# Patient Record
Sex: Male | Born: 1995 | Race: White | Hispanic: No | Marital: Single | Smoking: Never smoker
Health system: Southern US, Community
[De-identification: ages and names within clinical notes are randomized; demographics above are authoritative.]

---

## 2014-01-13 ENCOUNTER — Emergency Department (HOSPITAL_COMMUNITY): Payer: Self-pay

## 2014-01-13 ENCOUNTER — Encounter (HOSPITAL_COMMUNITY): Payer: Self-pay | Admitting: Emergency Medicine

## 2014-01-13 ENCOUNTER — Emergency Department (HOSPITAL_COMMUNITY)
Admission: EM | Admit: 2014-01-13 | Discharge: 2014-01-13 | Disposition: A | Discharge status: Home / Self Care | Payer: Self-pay | Attending: Emergency Medicine | Admitting: Emergency Medicine

## 2014-01-13 DIAGNOSIS — R197 Diarrhea, unspecified: Secondary | ICD-10-CM | POA: Insufficient documentation

## 2014-01-13 DIAGNOSIS — R109 Unspecified abdominal pain: Secondary | ICD-10-CM

## 2014-01-13 DIAGNOSIS — I499 Cardiac arrhythmia, unspecified: Secondary | ICD-10-CM | POA: Insufficient documentation

## 2014-01-13 DIAGNOSIS — Z792 Long term (current) use of antibiotics: Secondary | ICD-10-CM | POA: Insufficient documentation

## 2014-01-13 LAB — URINALYSIS, ROUTINE W REFLEX MICROSCOPIC
Bilirubin Urine: NEGATIVE
Glucose, UA: NEGATIVE mg/dL
Hgb urine dipstick: NEGATIVE
KETONES UR: NEGATIVE mg/dL
LEUKOCYTES UA: NEGATIVE
Nitrite: NEGATIVE
PROTEIN: NEGATIVE mg/dL
Specific Gravity, Urine: >1.03 — ABNORMAL HIGH (ref 1.005–1.030)
UROBILINOGEN UA: 0.2 mg/dL (ref 0.0–1.0)
pH: 5.5 (ref 5.0–8.0)

## 2014-01-13 LAB — LIPASE, BLOOD: LIPASE: 26 U/L (ref 11–59)

## 2014-01-13 LAB — COMPREHENSIVE METABOLIC PANEL
ALBUMIN: 4.3 g/dL (ref 3.5–5.2)
ALT: 50 U/L (ref 0–53)
AST: 23 U/L (ref 0–37)
Alkaline Phosphatase: 120 U/L — ABNORMAL HIGH (ref 39–117)
Anion gap: 12 (ref 5–15)
BUN: 10 mg/dL (ref 6–23)
CALCIUM: 9.8 mg/dL (ref 8.4–10.5)
CO2: 27 meq/L (ref 19–32)
Chloride: 102 mEq/L (ref 96–112)
Creatinine, Ser: 0.79 mg/dL (ref 0.50–1.35)
GFR calc Af Amer: >90 mL/min (ref >90)
Glucose, Bld: 108 mg/dL — ABNORMAL HIGH (ref 70–99)
Potassium: 4 mEq/L (ref 3.7–5.3)
SODIUM: 141 meq/L (ref 137–147)
Total Bilirubin: 0.5 mg/dL (ref 0.3–1.2)
Total Protein: 7.9 g/dL (ref 6.0–8.3)

## 2014-01-13 LAB — CBC WITH DIFFERENTIAL/PLATELET
BASOS ABS: 0 10*3/uL (ref 0.0–0.1)
Basophils Relative: 0 % (ref 0–1)
EOS PCT: 3 % (ref 0–5)
Eosinophils Absolute: 0.2 10*3/uL (ref 0.0–0.7)
HCT: 48.1 % (ref 39.0–52.0)
Hemoglobin: 16.4 g/dL (ref 13.0–17.0)
Lymphocytes Relative: 42 % (ref 12–46)
Lymphs Abs: 3.2 10*3/uL (ref 0.7–4.0)
MCH: 29.8 pg (ref 26.0–34.0)
MCHC: 34.1 g/dL (ref 30.0–36.0)
MCV: 87.3 fL (ref 78.0–100.0)
Monocytes Absolute: 0.7 10*3/uL (ref 0.1–1.0)
Monocytes Relative: 9 % (ref 3–12)
NEUTROS ABS: 3.6 10*3/uL (ref 1.7–7.7)
Neutrophils Relative %: 46 % (ref 43–77)
PLATELETS: 324 10*3/uL (ref 150–400)
RBC: 5.51 MIL/uL (ref 4.22–5.81)
RDW: 12.4 % (ref 11.5–15.5)
WBC: 7.7 10*3/uL (ref 4.0–10.5)

## 2014-01-13 MED ORDER — IOHEXOL 300 MG/ML  SOLN
50.0000 mL | Freq: Once | INTRAMUSCULAR | Status: AC | PRN
  Administered 2014-01-13: 50 mL via ORAL

## 2014-01-13 MED ORDER — SODIUM CHLORIDE 0.9 % IV BOLUS (SEPSIS)
1000.0000 mL | Freq: Once | INTRAVENOUS | Status: AC
  Administered 2014-01-13: 1000 mL via INTRAVENOUS

## 2014-01-13 MED ORDER — ONDANSETRON HCL 4 MG/2ML IJ SOLN
4.0000 mg | Freq: Once | INTRAMUSCULAR | Status: AC
  Administered 2014-01-13: 4 mg via INTRAVENOUS
  Filled 2014-01-13: qty 2

## 2014-01-13 MED ORDER — ONDANSETRON HCL 4 MG PO TABS: 4.0000 mg | ORAL_TABLET | Freq: Four times a day (QID) | ORAL | Status: AC

## 2014-01-13 MED ORDER — MORPHINE SULFATE 4 MG/ML IJ SOLN
4.0000 mg | Freq: Once | INTRAMUSCULAR | Status: AC
  Administered 2014-01-13: 4 mg via INTRAVENOUS
  Filled 2014-01-13: qty 1

## 2014-01-13 MED ORDER — IOHEXOL 300 MG/ML  SOLN
100.0000 mL | Freq: Once | INTRAMUSCULAR | Status: AC | PRN
  Administered 2014-01-13: 100 mL via INTRAVENOUS

## 2014-01-13 NOTE — ED Provider Notes (Signed)
CSN: [id number]     Arrival date & time 01/13/14  [id number] History   First MD Initiated Contact with Patient 01/13/14 0356     Chief Complaint  Patient presents with  . Abdominal Pain     (Consider location/radiation/quality/duration/timing/severity/associated sxs/prior Treatment) HPI Comments: Patient complains of one hour of diffuse abdominal pain worsens epigastrium that is constant. Denies any nausea, vomiting or diarrhea. He states just prior to my evaluation he had an episode of bloody diarrhea. Denies any fever, urinary symptoms. Denies any sick contacts. Denies having this pain in the past. Denies any previous evaluations for this problem. Denies any testicular pain. States the pain woke him from sleep. Nothing makes it better or worse. No previous abdominal surgeries  The history is provided by the patient.    History reviewed. No pertinent past medical history. History reviewed. No pertinent past surgical history. No family history on file. History  Substance Use Topics  . Smoking status: Never Smoker   . Smokeless tobacco: Not on file  . Alcohol Use: No    Review of Systems  Constitutional: Negative for fever, activity change and appetite change.  HENT: Negative for congestion and rhinorrhea.   Eyes: Negative for visual disturbance.  Respiratory: Negative for cough, chest tightness and shortness of breath.   Cardiovascular: Negative for chest pain.  Gastrointestinal: Positive for abdominal pain, diarrhea and blood in stool. Negative for nausea and vomiting.  Genitourinary: Negative for dysuria, urgency, hematuria and testicular pain.  Musculoskeletal: Negative for arthralgias, back pain and myalgias.  Skin: Negative for rash.  Neurological: Negative for dizziness, weakness and headaches.  A complete 10 system review of systems was obtained and all systems are negative except as noted in the HPI and PMH.      Allergies  Review of patient's allergies indicates no known  allergies.  Home Medications   Prior to Admission medications   Medication Sig Start Date End Date Taking? Authorizing Provider  amoxicillin (AMOXIL) 500 MG capsule Take 500 mg by mouth 3 (three) times daily.   Yes Historical Provider, MD  ondansetron (ZOFRAN) 4 MG tablet Take 1 tablet (4 mg total) by mouth every 6 (six) hours. 01/13/14   Glynn OctaveStephen Jacy Howat, MD   BP 130/93  Pulse 84  Temp(Src) 97.7 F (36.5 C) (Oral)  Resp 20  Ht 6\' 2"  (1.88 m)  Wt 220 lb (99.791 kg)  BMI 28.23 kg/m2  SpO2 100% Physical Exam  Nursing note and vitals reviewed. Constitutional: He is oriented to person, place, and time. He appears well-developed and well-nourished. No distress.  HENT:  Head: Normocephalic and atraumatic.  Mouth/Throat: Oropharynx is clear and moist. No oropharyngeal exudate.  Eyes: Conjunctivae and EOM are normal. Pupils are equal, round, and reactive to light.  Neck: Normal range of motion. Neck supple.  No meningismus.  Cardiovascular: Normal rate, normal heart sounds and intact distal pulses.   No murmur heard. Irregular rhythm  Pulmonary/Chest: Effort normal and breath sounds normal. No respiratory distress.  Abdominal: Soft. There is tenderness. There is no rebound and no guarding.  Diffuse tenderness, worse in the epigastrium  Genitourinary:  Normal external rectal exam. No hemorrhoids Patient refuses digital exam.  Musculoskeletal: Normal range of motion. He exhibits no edema and no tenderness.  Neurological: He is alert and oriented to person, place, and time. No cranial nerve deficit. He exhibits normal muscle tone. Coordination normal.  No ataxia on finger to nose bilaterally. No pronator drift. 5/5 strength throughout. CN 2-12 intact. Negative Romberg.  Equal grip strength. Sensation intact. Gait is normal.   Skin: Skin is warm.  Psychiatric: He has a normal mood and affect. His behavior is normal.    ED Course  Procedures (including critical care time) Labs  Review Labs Reviewed  URINALYSIS, ROUTINE W REFLEX MICROSCOPIC - Abnormal; Notable for the following:    APPearance HAZY (*)    Specific Gravity, Urine >1.030 (*)    All other components within normal limits  COMPREHENSIVE METABOLIC PANEL - Abnormal; Notable for the following:    Glucose, Bld 108 (*)    Alkaline Phosphatase 120 (*)    All other components within normal limits  CBC WITH DIFFERENTIAL  LIPASE, BLOOD    Imaging Review Ct Abdomen Pelvis W Contrast  01/13/2014   CLINICAL DATA:  Sharp epigastric pain onset 1 hour prior arrival. Vomiting.  EXAM: CT ABDOMEN AND PELVIS WITH CONTRAST  TECHNIQUE: Multidetector CT imaging of the abdomen and pelvis was performed using the standard protocol following bolus administration of intravenous contrast.  CONTRAST:  50mL OMNIPAQUE IOHEXOL 300 MG/ML SOLN, 100mL OMNIPAQUE IOHEXOL 300 MG/ML SOLN  COMPARISON:  None.  FINDINGS: Lung bases are clear.  The liver, spleen, gallbladder, pancreas, adrenal glands, kidneys, abdominal aorta, inferior vena cava, and retroperitoneal lymph nodes are unremarkable. Stomach is filled with contrast material and stool. No gastric wall thickening. Visualize small bowel and colon are unremarkable for degree of distention. No free air or free fluid in the abdomen. No bowel wall thickening is appreciated. Mild prominence of lymph nodes in the right lower quadrant likely represent reactive change. Abdominal wall musculature appears intact.  Pelvis: Prostate gland is not enlarged. Bladder wall is not thickened. Appendix is normal. No evidence of diverticulitis. No free or loculated pelvic fluid collections. No pelvic mass or lymphadenopathy. Normal alignment of the lumbar spine.  IMPRESSION: No acute process demonstrated in the abdomen or pelvis.   Electronically Signed   By: Burman NievesWilliam  Stevens M.D.   On: 01/13/2014 05:38     EKG Interpretation   Date/Time:  Thursday January 13 2014 06:09:23 EDT Ventricular Rate:  55 PR  Interval:  106 QRS Duration: 122 QT Interval:  410 QTC Calculation: 392 R Axis:   24 Text Interpretation:  Sinus rhythm Short PR interval Right bundle branch  block No previous ECGs available Confirmed by Christeena Krogh  MD, Jerrol Helmers [id number])  on 01/13/2014 6:12:59 AM      MDM   Final diagnoses:  Abdominal pain, unspecified abdominal location   abdominal pain with one episode of diarrhea. Abdomen soft without peritoneal signs. Patient refuses rectal exam. No RUQ tenderness.  Friends states patient was hospitalized 2 weeks ago in [place] for difficulty breathing, fever, and irregular heart beat. He states he was diagnosed with strep throat and wore a heart monitor without diagnosis.   EKG obtained given irregular rhythm on exam.  CBC normal. UA negative. Labs unremarkable.   CT negative. No acute pathology.  Pain improved in the ED.  Abdomen soft.  Patient tolerating PO. He is visiting from [place] and plans to return there in 2 days.  Return precaution discussed.   BP 130/93  Pulse 84  Temp(Src) 97.7 F (36.5 C) (Oral)  Resp 20  Ht 6\' 2"  (1.88 m)  Wt 220 lb (99.791 kg)  BMI 28.23 kg/m2  SpO2 100%   Glynn OctaveStephen Loribeth Katich, MD 01/13/14 [phone]

## 2014-01-13 NOTE — ED Notes (Signed)
Pt reports onset of abd pain that started about an hour ago, reports sharp pains, but denies n/v/d. Patient and friend report pt has had multiple hospital visits for various complaints without a definitive diagnosis.

## 2014-01-13 NOTE — Discharge Instructions (Signed)
Abdominal Pain °Your testing is negative for a serious cause of your abdominal pain.  Follow up with your doctor. Return to the ED if you develop new or worsening symptoms. °Many things can cause abdominal pain. Usually, abdominal pain is not caused by a disease and will improve without treatment. It can often be observed and treated at home. Your health care provider will do a physical exam and possibly order blood tests and X-rays to help determine the seriousness of your pain. However, in many cases, more time must pass before a clear cause of the pain can be found. Before that point, your health care provider may not know if you need more testing or further treatment. °HOME CARE INSTRUCTIONS  °Monitor your abdominal pain for any changes. The following actions may help to alleviate any discomfort you are experiencing: °· Only take over-the-counter or prescription medicines as directed by your health care provider. °· Do not take laxatives unless directed to do so by your health care provider. °· Try a clear liquid diet (broth, tea, or water) as directed by your health care provider. Slowly move to a bland diet as tolerated. °SEEK MEDICAL CARE IF: °· You have unexplained abdominal pain. °· You have abdominal pain associated with nausea or diarrhea. °· You have pain when you urinate or have a bowel movement. °· You experience abdominal pain that wakes you in the night. °· You have abdominal pain that is worsened or improved by eating food. °· You have abdominal pain that is worsened with eating fatty foods. °· You have a fever. °SEEK IMMEDIATE MEDICAL CARE IF:  °· Your pain does not go away within 2 hours. °· You keep throwing up (vomiting). °· Your pain is felt only in portions of the abdomen, such as the right side or the left lower portion of the abdomen. °· You pass bloody or black tarry stools. °MAKE SURE YOU: °· Understand these instructions.   °· Will watch your condition.   °· Will get help right away if  you are not doing well or get worse.   °Document Released: 12/19/2004 Document Revised: 03/16/2013 Document Reviewed: 11/18/2012 °ExitCare® Patient Information ©2015 [employer]. This information is not intended to replace advice given to you by your health care provider. Make sure you discuss any questions you have with your health care provider. ° °

## 2014-11-17 ENCOUNTER — Emergency Department (HOSPITAL_COMMUNITY): Payer: Self-pay

## 2014-11-17 ENCOUNTER — Emergency Department (HOSPITAL_COMMUNITY)
Admission: EM | Admit: 2014-11-17 | Discharge: 2014-11-17 | Disposition: A | Discharge status: Home / Self Care | Payer: Self-pay | Attending: Emergency Medicine | Admitting: Emergency Medicine

## 2014-11-17 ENCOUNTER — Encounter (HOSPITAL_COMMUNITY): Payer: Self-pay | Admitting: Emergency Medicine

## 2014-11-17 DIAGNOSIS — Z792 Long term (current) use of antibiotics: Secondary | ICD-10-CM | POA: Insufficient documentation

## 2014-11-17 DIAGNOSIS — R000 Tachycardia, unspecified: Secondary | ICD-10-CM | POA: Insufficient documentation

## 2014-11-17 DIAGNOSIS — R079 Chest pain, unspecified: Secondary | ICD-10-CM

## 2014-11-17 DIAGNOSIS — R071 Chest pain on breathing: Secondary | ICD-10-CM

## 2014-11-17 DIAGNOSIS — J189 Pneumonia, unspecified organism: Secondary | ICD-10-CM

## 2014-11-17 DIAGNOSIS — J159 Unspecified bacterial pneumonia: Secondary | ICD-10-CM | POA: Insufficient documentation

## 2014-11-17 LAB — TROPONIN I

## 2014-11-17 LAB — CBC WITH DIFFERENTIAL/PLATELET
BASOS ABS: 0 10*3/uL (ref 0.0–0.1)
Basophils Relative: 0 % (ref 0–1)
EOS PCT: 11 % — AB (ref 0–5)
Eosinophils Absolute: 1.2 10*3/uL — ABNORMAL HIGH (ref 0.0–0.7)
HCT: 45.1 % (ref 39.0–52.0)
HEMOGLOBIN: 15.6 g/dL (ref 13.0–17.0)
LYMPHS ABS: 2 10*3/uL (ref 0.7–4.0)
Lymphocytes Relative: 17 % (ref 12–46)
MCH: 30.7 pg (ref 26.0–34.0)
MCHC: 34.6 g/dL (ref 30.0–36.0)
MCV: 88.8 fL (ref 78.0–100.0)
Monocytes Absolute: 0.7 10*3/uL (ref 0.1–1.0)
Monocytes Relative: 6 % (ref 3–12)
NEUTROS PCT: 66 % (ref 43–77)
Neutro Abs: 7.4 10*3/uL (ref 1.7–7.7)
PLATELETS: 344 10*3/uL (ref 150–400)
RBC: 5.08 MIL/uL (ref 4.22–5.81)
RDW: 12.1 % (ref 11.5–15.5)
WBC: 11.4 10*3/uL — AB (ref 4.0–10.5)

## 2014-11-17 LAB — BASIC METABOLIC PANEL
ANION GAP: 9 (ref 5–15)
BUN: 7 mg/dL (ref 6–20)
CHLORIDE: 102 mmol/L (ref 101–111)
CO2: 27 mmol/L (ref 22–32)
Calcium: 9.1 mg/dL (ref 8.9–10.3)
Creatinine, Ser: 0.8 mg/dL (ref 0.61–1.24)
GFR calc Af Amer: >60 mL/min (ref >60)
Glucose, Bld: 113 mg/dL — ABNORMAL HIGH (ref 65–99)
POTASSIUM: 3.4 mmol/L — AB (ref 3.5–5.1)
SODIUM: 138 mmol/L (ref 135–145)

## 2014-11-17 MED ORDER — ONDANSETRON HCL 4 MG/2ML IJ SOLN
4.0000 mg | Freq: Once | INTRAMUSCULAR | Status: AC
  Administered 2014-11-17: 4 mg via INTRAVENOUS
  Filled 2014-11-17: qty 2

## 2014-11-17 MED ORDER — GUAIFENESIN-CODEINE 100-10 MG/5ML PO SOLN: 5.0000 mL | Freq: Four times a day (QID) | ORAL | Status: AC | PRN

## 2014-11-17 MED ORDER — IOHEXOL 350 MG/ML SOLN
100.0000 mL | Freq: Once | INTRAVENOUS | Status: AC | PRN
  Administered 2014-11-17: 100 mL via INTRAVENOUS

## 2014-11-17 MED ORDER — IBUPROFEN 800 MG PO TABS: 800.0000 mg | ORAL_TABLET | Freq: Three times a day (TID) | ORAL | Status: AC | PRN

## 2014-11-17 MED ORDER — LEVOFLOXACIN IN D5W 750 MG/150ML IV SOLN
750.0000 mg | Freq: Once | INTRAVENOUS | Status: AC
  Administered 2014-11-17: 750 mg via INTRAVENOUS
  Filled 2014-11-17: qty 150

## 2014-11-17 MED ORDER — KETOROLAC TROMETHAMINE 30 MG/ML IJ SOLN
30.0000 mg | Freq: Once | INTRAMUSCULAR | Status: AC
  Administered 2014-11-17: 30 mg via INTRAVENOUS
  Filled 2014-11-17: qty 1

## 2014-11-17 MED ORDER — SODIUM CHLORIDE 0.9 % IV BOLUS (SEPSIS)
1000.0000 mL | Freq: Once | INTRAVENOUS | Status: AC
  Administered 2014-11-17: 1000 mL via INTRAVENOUS

## 2014-11-17 MED ORDER — POTASSIUM CHLORIDE CRYS ER 20 MEQ PO TBCR
40.0000 meq | EXTENDED_RELEASE_TABLET | Freq: Once | ORAL | Status: AC
  Administered 2014-11-17: 40 meq via ORAL
  Filled 2014-11-17: qty 2

## 2014-11-17 MED ORDER — LEVOFLOXACIN 500 MG PO TABS: 500.0000 mg | ORAL_TABLET | Freq: Every day | ORAL | Status: AC

## 2014-11-17 NOTE — ED Notes (Signed)
Pt c/o intermittent chest pain over last couple of days.

## 2014-11-17 NOTE — ED Provider Notes (Signed)
TIME SEEN: 2:25 AM  CHIEF COMPLAINT: Chest pain, shortness of breath, cough  HPI: Pt is a 19 y.o. male who presents to the emergency department with complaints of 3 days of left-sided sharp chest pain that is worse with deep inspiration without radiation that is described as moderate in nature, shortness of breath and dry cough. No alleviating factors. States he's had similar symptoms when he had pneumonia in April. Was admitted to the hospital and [facility] at that time. Denies fever. Denies any recent prolonged immobilization such as long flight, hospitalization, fracture, surgery, trauma. Denies prior history of PE or DVT. Denies being a smoker. No family history of premature CAD. Patient is from [facility] but states currently his living here with a friend.  ROS: See HPI Constitutional: no fever  Eyes: no drainage  ENT: no runny nose   Cardiovascular:   chest pain  Resp:  SOB  GI: no vomiting GU: no dysuria Integumentary: no rash  Allergy: no hives  Musculoskeletal: no leg swelling  Neurological: no slurred speech ROS otherwise negative  PAST MEDICAL HISTORY/PAST SURGICAL HISTORY:  History reviewed. No pertinent past medical history.  MEDICATIONS:  Prior to Admission medications   Medication Sig Start Date End Date Taking? Authorizing Provider  amoxicillin (AMOXIL) 500 MG capsule Take 500 mg by mouth 3 (three) times daily.    Historical Provider, MD  ondansetron (ZOFRAN) 4 MG tablet Take 1 tablet (4 mg total) by mouth every 6 (six) hours. 01/13/14   Glynn Octave, MD    ALLERGIES:  No Known Allergies  SOCIAL HISTORY:  Social History  Substance Use Topics  . Smoking status: Never Smoker   . Smokeless tobacco: Not on file  . Alcohol Use: Yes    FAMILY HISTORY: History reviewed. No pertinent family history.  EXAM: BP 149/89 mmHg  Pulse 118  Temp(Src) 98.4 F (36.9 C)  Ht 6' (1.829 m)  Wt 210 lb (95.255 kg)  BMI 28.47 kg/m2  SpO2 97% CONSTITUTIONAL: Alert and  oriented and responds appropriately to questions. Well-appearing; well-nourished, afebrile, nontoxic but does appear uncomfortable HEAD: Normocephalic EYES: Conjunctivae clear, PERRL ENT: normal nose; no rhinorrhea; moist mucous membranes; pharynx without lesions noted NECK: Supple, no meningismus, no LAD  CARD: Regular and tachycardic; S1 and S2 appreciated; no murmurs, no clicks, no rubs, no gallops RESP: Normal chest excursion without splinting, patient is tachypneic; breath sounds clear and equal bilaterally; no wheezes, no rhonchi, no rales, no hypoxia or respiratory distress, speaking full sentences ABD/GI: Normal bowel sounds; non-distended; soft, non-tender, no rebound, no guarding, no peritoneal signs BACK:  The back appears normal and is non-tender to palpation, there is no CVA tenderness EXT: Normal ROM in all joints; non-tender to palpation; no edema; normal capillary refill; no cyanosis, no calf tenderness or swelling    SKIN: Normal color for age and race; warm NEURO: Moves all extremities equally, sensation to light touch intact diffusely, cranial nerves II through XII intact PSYCH: The patient's mood and manner are appropriate. Grooming and personal hygiene are appropriate.  MEDICAL DECISION MAKING: Patient here with tachycardia, tachypnea, pleuritic chest pain. Differential diagnosis includes pneumonia, pneumothorax, pulmonary embolus. Will obtain labs, CT of patient's chest given my pretest probability for pulmonary embolus is high. We'll give IV fluids, Toradol for pain. He states he drove himself to the emergency department and at this time does not have anyone who can drive him home at this time. EKG shows sinus tachycardia but no other new ischemic changes, interval changes or arrhythmia.  ED PROGRESS: Patient reports his pain is improved with Toradol. His labs show mild leukocytosis of 11.4 without left shift. Troponin negative. CT of his chest shows no pulmonary embolus but he  does have mild peribronchial thickening with scattered groundglass opacities and septal thickening in the right hemithorax likely infectious in nature. He also has reactive right hilar adenopathy. His tachycardia and tachypnea have also improved with IV fluids. He has not been hypoxic in the emergency department. Discussed with patient that I feel he can be discharged on oral antibiotics. Will give dose of IV Levaquin in the emergency department. Will provide him with a coupon for his medications. At this time I do not feel he needs to be admitted to the hospital. He is comfortable with this plan. Discussed usual and customary return precautions. He verbalized understanding.     EKG Interpretation  Date/Time:  Thursday November 17 2014 02:17:09 EDT Ventricular Rate:  114 PR Interval:  150 QRS Duration: 119 QT Interval:  331 QTC Calculation: 456 R Axis:   -85 Text Interpretation:  Sinus tachycardia LAD, consider left anterior fascicular block No significant change since last tracing other than rate is faster Confirmed by Ysabella Babiarz,  DO, Cosette Prindle ([id number]) on 11/17/2014 2:29:31 AM          Layla Maw Latajah Thuman, DO 11/17/14 [id number]

## 2014-11-17 NOTE — Discharge Instructions (Signed)

## 2015-07-12 ENCOUNTER — Emergency Department
Admission: EM | Admit: 2015-07-12 | Discharge: 2015-07-12 | Disposition: A | Discharge status: Home / Self Care | Payer: Self-pay | Attending: Emergency Medicine | Admitting: Emergency Medicine

## 2015-07-12 ENCOUNTER — Encounter: Payer: Self-pay | Admitting: Emergency Medicine

## 2015-07-12 DIAGNOSIS — M25562 Pain in left knee: Secondary | ICD-10-CM | POA: Insufficient documentation

## 2015-07-12 DIAGNOSIS — K920 Hematemesis: Secondary | ICD-10-CM

## 2015-07-12 DIAGNOSIS — Z79899 Other long term (current) drug therapy: Secondary | ICD-10-CM | POA: Insufficient documentation

## 2015-07-12 DIAGNOSIS — M25569 Pain in unspecified knee: Secondary | ICD-10-CM

## 2015-07-12 DIAGNOSIS — Z792 Long term (current) use of antibiotics: Secondary | ICD-10-CM | POA: Insufficient documentation

## 2015-07-12 DIAGNOSIS — J069 Acute upper respiratory infection, unspecified: Secondary | ICD-10-CM | POA: Insufficient documentation

## 2015-07-12 DIAGNOSIS — G8929 Other chronic pain: Secondary | ICD-10-CM | POA: Insufficient documentation

## 2015-07-12 DIAGNOSIS — Z791 Long term (current) use of non-steroidal anti-inflammatories (NSAID): Secondary | ICD-10-CM | POA: Insufficient documentation

## 2015-07-12 LAB — CBC
HEMATOCRIT: 46.6 % (ref 40.0–52.0)
Hemoglobin: 16.1 g/dL (ref 13.0–18.0)
MCH: 30.5 pg (ref 26.0–34.0)
MCHC: 34.5 g/dL (ref 32.0–36.0)
MCV: 88.3 fL (ref 80.0–100.0)
Platelets: 243 10*3/uL (ref 150–440)
RBC: 5.28 MIL/uL (ref 4.40–5.90)
RDW: 13.2 % (ref 11.5–14.5)
WBC: 7.8 10*3/uL (ref 3.8–10.6)

## 2015-07-12 LAB — COMPREHENSIVE METABOLIC PANEL
ALT: 41 U/L (ref 17–63)
ANION GAP: 7 (ref 5–15)
AST: 25 U/L (ref 15–41)
Albumin: 4.6 g/dL (ref 3.5–5.0)
Alkaline Phosphatase: 91 U/L (ref 38–126)
BILIRUBIN TOTAL: 0.8 mg/dL (ref 0.3–1.2)
BUN: 7 mg/dL (ref 6–20)
CO2: 28 mmol/L (ref 22–32)
Calcium: 9.6 mg/dL (ref 8.9–10.3)
Chloride: 105 mmol/L (ref 101–111)
Creatinine, Ser: 0.6 mg/dL — ABNORMAL LOW (ref 0.61–1.24)
Glucose, Bld: 112 mg/dL — ABNORMAL HIGH (ref 65–99)
POTASSIUM: 3.5 mmol/L (ref 3.5–5.1)
Sodium: 140 mmol/L (ref 135–145)
TOTAL PROTEIN: 7.5 g/dL (ref 6.5–8.1)

## 2015-07-12 LAB — LIPASE, BLOOD: Lipase: 17 U/L (ref 11–51)

## 2015-07-12 MED ORDER — PANTOPRAZOLE SODIUM 40 MG PO TBEC
40.0000 mg | DELAYED_RELEASE_TABLET | Freq: Once | ORAL | Status: AC
  Administered 2015-07-12: 40 mg via ORAL

## 2015-07-12 MED ORDER — OMEPRAZOLE 40 MG PO CPDR: 40.0000 mg | DELAYED_RELEASE_CAPSULE | Freq: Every day | ORAL | Status: AC

## 2015-07-12 MED ORDER — PANTOPRAZOLE SODIUM 40 MG PO TBEC
DELAYED_RELEASE_TABLET | ORAL | Status: AC
  Administered 2015-07-12: 40 mg via ORAL
  Filled 2015-07-12: qty 1

## 2015-07-12 NOTE — Discharge Instructions (Signed)
Hematemesis Hematemesis is when you vomit blood. It is a sign of bleeding in the upper part of your digestive tract. This is also called your gastrointestinal (GI) tract. Your upper GI tract includes your mouth, throat, esophagus, stomach, and the first part of your small intestine (duodenum).  Hematemesis is usually caused by bleeding from your esophagus or stomach. You may suddenly vomit bright red blood. You might also vomit old blood. It may look like coffee grounds. You may also have other symptoms, such as:  Stomach pain.  Heartburn.  Black and tarry stool.  HOME CARE INSTRUCTIONS  Watch your hematemesis for any changes. The following actions may help to lessen any discomfort you are feeling:  Take medicines only as directed by your health care provider. Do not take aspirin, ibuprofen, or any other anti-inflammatory medicine without approval from your health care provider.  Rest as needed.  Drink small sips of clear liquids often, as long as you can keep them down. Try to drink enough fluids to keep your urine clear or pale yellow.  Do not drink alcohol.  Do not use any tobacco products, including cigarettes, chewing tobacco, or electronic cigarettes. If you need help quitting, ask your health care provider.  Keep all follow-up visits as directed by your health care provider. This is important. SEEK MEDICAL CARE IF:   The vomiting of blood worsens, or begins again after it has stopped.  You have persistent stomach pain.  You have nausea, indigestion, or heartburn.  You feel weak or dizzy. SEEK IMMEDIATE MEDICAL CARE IF:   You faint or feel extremely weak.  You have a rapid heartbeat.  You are urinating less than normal or not at all.  You have persistent vomiting.  You vomit large amounts of bloody or dark material.  You vomit bright red blood.  You pass large, dark, or bloody stools.  You have chest pain or trouble breathing.   This information is not  intended to replace advice given to you by your health care provider. Make sure you discuss any questions you have with your health care provider.   Document Released: 04/18/2004 Document Revised: 04/01/2014 Document Reviewed: 11/03/2013 Elsevier Interactive Patient Education 2016 [employer]  Knee Pain Knee pain is a very common symptom and can have many causes. Knee pain often goes away when you follow your health care provider's instructions for relieving pain and discomfort at home. However, knee pain can develop into a condition that needs treatment. Some conditions may include:  Arthritis caused by wear and tear (osteoarthritis).  Arthritis caused by swelling and irritation (rheumatoid arthritis or gout).  A cyst or growth in your knee.  An infection in your knee joint.  An injury that will not heal.  Damage, swelling, or irritation of the tissues that support your knee (torn ligaments or tendinitis). If your knee pain continues, additional tests may be ordered to diagnose your condition. Tests may include X-rays or other imaging studies of your knee. You may also need to have fluid removed from your knee. Treatment for ongoing knee pain depends on the cause, but treatment may include:  Medicines to relieve pain or swelling.  Steroid injections in your knee.  Physical therapy.  Surgery. HOME CARE INSTRUCTIONS  Take medicines only as directed by your health care provider.  Rest your knee and keep it raised (elevated) while you are resting.  Do not do things that cause or worsen pain.  Avoid high-impact activities or exercises, such as running, jumping  rope, or doing jumping jacks.  Apply ice to the knee area:  Put ice in a plastic bag.  Place a towel between your skin and the bag.  Leave the ice on for 20 minutes, 2-3 times a day.  Ask your health care provider if you should wear an elastic knee support.  Keep a pillow under your knee when you sleep.  Lose  weight if you are overweight. Extra weight can put pressure on your knee.  Do not use any tobacco products, including cigarettes, chewing tobacco, or electronic cigarettes. If you need help quitting, ask your health care provider. Smoking may slow the healing of any bone and joint problems that you may have. SEEK MEDICAL CARE IF:  Your knee pain continues, changes, or gets worse.  You have a fever along with knee pain.  Your knee buckles or locks up.  Your knee becomes more swollen. SEEK IMMEDIATE MEDICAL CARE IF:   Your knee joint feels hot to the touch.  You have chest pain or trouble breathing.   This information is not intended to replace advice given to you by your health care provider. Make sure you discuss any questions you have with your health care provider.   Document Released: 01/06/2007 Document Revised: 04/01/2014 Document Reviewed: 10/25/2013 Elsevier Interactive Patient Education [employer]

## 2015-07-12 NOTE — ED Provider Notes (Signed)
[facility] Emergency Department Provider Note  ____________________________________________  Time seen: 3:40 AM  I have reviewed the triage vital signs and the nursing notes.   HISTORY  Chief Complaint Abdominal Pain and Hematemesis     HPI Edwin Fischer is a 20 y.o. male presents with multiple medical complaints including left knee pain 7 years with acute worsening yesterday. Patient denies any trauma stating my knee gave out". Patient states he was informed that he had tendinitis in his knees years ago. Patient also admits to nasal congestion times a few days. Patient denies any fever patient also admits to one episode of vomiting today with blood noted. Patient admits to abdominal tightness but no pain at this time. Patient denies any fever no nausea or diarrhea. Patient states that he's been working approximately 60-70 hours per week and is requesting a work note for 2-3 days.    Past medical history None There are no active problems to display for this patient.   History reviewed. No pertinent past surgical history.  Current Outpatient Rx  Name  Route  Sig  Dispense  Refill  . amoxicillin (AMOXIL) 500 MG capsule   Oral   Take 500 mg by mouth 3 (three) times daily.         Marland Kitchen guaiFENesin-codeine 100-10 MG/5ML syrup   Oral   Take 5 mLs by mouth every 6 (six) hours as needed for cough.   120 mL   0   . ibuprofen (ADVIL,MOTRIN) 800 MG tablet   Oral   Take 1 tablet (800 mg total) by mouth every 8 (eight) hours as needed for mild pain.   30 tablet   0   . levofloxacin (LEVAQUIN) 500 MG tablet   Oral   Take 1 tablet (500 mg total) by mouth daily.   7 tablet   0   . ondansetron (ZOFRAN) 4 MG tablet   Oral   Take 1 tablet (4 mg total) by mouth every 6 (six) hours.   12 tablet   0     Allergies No known drug allergies No family history on file.  Social History Social History  Substance Use Topics  . Smoking status: Never Smoker    . Smokeless tobacco: None  . Alcohol Use: Yes    Review of Systems  Constitutional: Negative for fever. Eyes: Negative for visual changes. ENT: Negative for sore throat. Cardiovascular: Negative for chest pain. Respiratory: Negative for shortness of breath. Gastrointestinal: Positive for abdominal "tightness" and vomiting. Genitourinary: Negative for dysuria. Musculoskeletal: Negative for back pain. Skin: Negative for rash. Neurological: Negative for headaches, focal weakness or numbness.   10-point ROS otherwise negative.  ____________________________________________   PHYSICAL EXAM:  VITAL SIGNS: ED Triage Vitals  Enc Vitals Group     BP 07/12/15 0058 160/92 mmHg     Pulse Rate 07/12/15 0058 96     Resp 07/12/15 0058 20     Temp 07/12/15 0058 97.9 F (36.6 C)     Temp Source 07/12/15 0058 Oral     SpO2 07/12/15 0058 99 %     Weight 07/12/15 0058 210 lb (95.255 kg)     Height 07/12/15 0058 6' (1.829 m)     Head Cir --      Peak Flow --      Pain Score 07/12/15 0058 9     Pain Loc --      Pain Edu? --      Excl. in GC? --  Constitutional: Alert and oriented. Well appearing and in no distress. Eyes: Conjunctivae are normal. PERRL. Normal extraocular movements. ENT   Head: Normocephalic and atraumatic.   Nose: No congestion/rhinnorhea.   Mouth/Throat: Mucous membranes are moist.   Neck: No stridor. Hematological/Lymphatic/Immunilogical: No cervical lymphadenopathy. Cardiovascular: Normal rate, regular rhythm. Normal and symmetric distal pulses are present in all extremities. No murmurs, rubs, or gallops. Respiratory: Normal respiratory effort without tachypnea nor retractions. Breath sounds are clear and equal bilaterally. No wheezes/rales/rhonchi. Gastrointestinal: Soft and nontender. No distention. There is no CVA tenderness. Genitourinary: deferred Musculoskeletal: Nontender with normal range of motion in all extremities. No joint  effusions.  No lower extremity tenderness nor edema. Neurologic:  Normal speech and language. No gross focal neurologic deficits are appreciated. Speech is normal.  Skin:  Skin is warm, dry and intact. No rash noted. Psychiatric: Mood and affect are normal. Speech and behavior are normal. Patient exhibits appropriate insight and judgment.  ____________________________________________    LABS (pertinent positives/negatives)  Labs Reviewed  COMPREHENSIVE METABOLIC PANEL - Abnormal; Notable for the following:    Glucose, Bld 112 (*)    Creatinine, Ser 0.60 (*)    All other components within normal limits  LIPASE, BLOOD  CBC  URINALYSIS COMPLETEWITH MICROSCOPIC ([facility] ONLY)          INITIAL IMPRESSION / ASSESSMENT AND PLAN / ED COURSE  Pertinent labs & imaging results that were available during my care of the patient were reviewed by me and considered in my medical decision making (see chart for details).  Regarding the patient's left knee pain that this is a chronic problem for which she will require orthopedic outpatient follow-up which I will refer patient. Regarding the patient's nasal congestion this is more likely secondary to viral etiology versus seasonal allergies. Regarding the patient's episode of bloody emesis of a possible Mallory-Weiss tear secondary to forceful vomiting or potentially peptic ulcer. Given absence of abdominal pain on deep palpation at this time CT scan of the abdomen not performed. I advised patient to return to the emergency department immediately for abdominal pain were to recur or any further emesis.  ____________________________________________   FINAL CLINICAL IMPRESSION(S) / ED DIAGNOSES  Final diagnoses:  Knee pain, chronic, unspecified laterality  URI, acute  Hematemesis without nausea      Darci Currentandolph N Brown, MD 07/12/15 [phone]

## 2015-07-12 NOTE — ED Notes (Signed)
Pt discharged to home.  Family member driving.  Discharge instructions reviewed.  Verbalized understanding.  No questions or concerns at this time.  Teach back verified.  Pt in NAD.  No items left in ED.   

## 2015-07-12 NOTE — ED Notes (Signed)
Patient ambulatory to triage with steady gait, without difficulty or distress noted; pt reports vomiting blood today with lower abd pain; denies hx of same

## 2016-03-12 IMAGING — CT CT ANGIO CHEST
1 of 6 series · 5 of 36 positions shown · IV contrast (Omnipaque 300)
Comparison: None.

CLINICAL DATA: Intermittent chest pain, pain on breathing for 2-3
days.

EXAM:
CT ANGIOGRAPHY CHEST WITH CONTRAST
TECHNIQUE: Multidetector CT imaging of the chest was performed using the
standard protocol during bolus administration of intravenous
contrast. Multiplanar CT image reconstructions and MIPs were
obtained to evaluate the vascular anatomy.
CONTRAST:  100mL OMNIPAQUE IOHEXOL 350 MG/ML SOLN

[Series 4: pe 3.0 b40f · axial · 0.73mm/px · z∈[-198,-48]mm · 5 of 76 slices shown]
[im 13/76  lung]
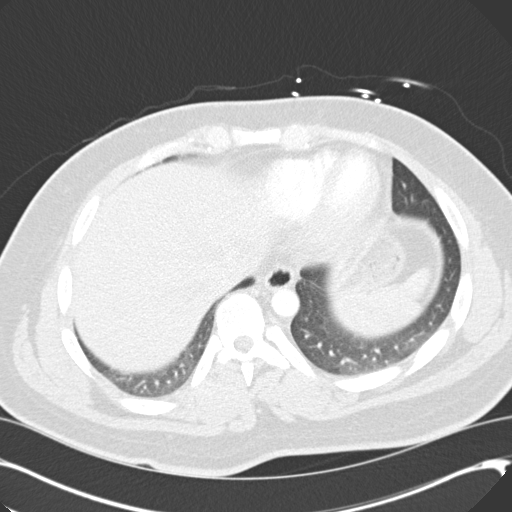
[im 26/76  mediastinal]
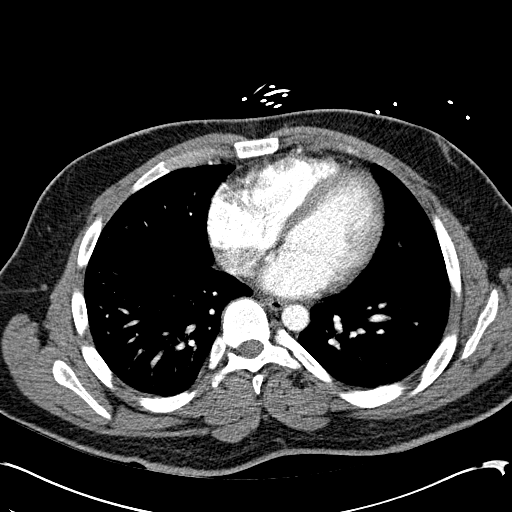
[im 38/76  lung]
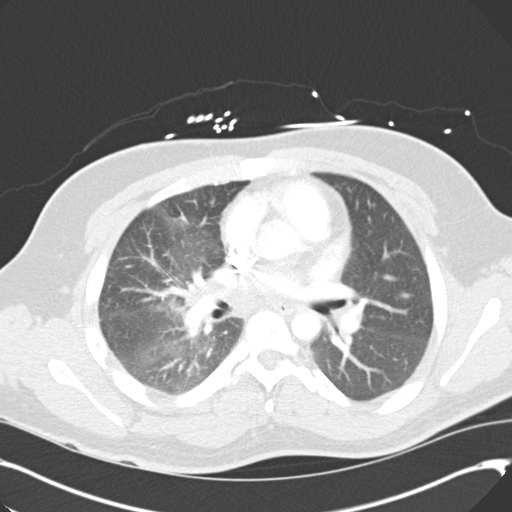
[im 51/76  mediastinal]
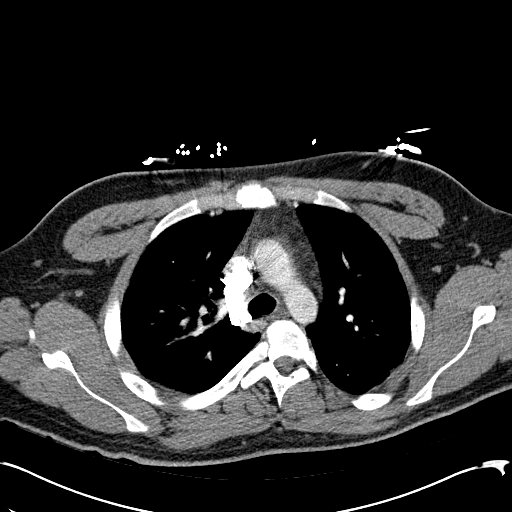
[im 63/76  lung]
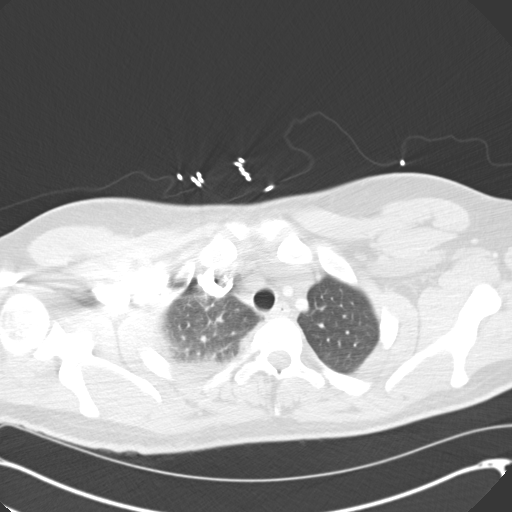

[5 of 36 positions shown; findings below may reference images not displayed]

FINDINGS: There are no filling defects within the pulmonary arteries to
suggest pulmonary embolus.

The thoracic aorta is normal in caliber. There is a conventional
branching pattern from the aortic arch. The heart size is normal.
Mild right hilar adenopathy, suprahilar lymph node measures 1.4 cm
in short axis dimension. Mildly prominent subcarinal lymph node. No
left hilar adenopathy. No pleural or pericardial effusion.

Bronchial thickening and scattered ground-glass opacities in the
right hemithorax. There is mild smooth septal thickening. No
confluent airspace disease. No pulmonary nodule or mass.

No acute abnormality in the included upper abdomen. Distal esophagus
is mildly patulous.

There is scoliotic curvature of the thoracic spine. There are no
acute or suspicious osseous abnormalities.

Review of the MIP images confirms the above findings.
IMPRESSION: 1. No pulmonary embolus.
2. Mild bronchial thickening with scattered ground-glass opacities
and septal thickening in the right hemithorax. Findings are likely
infectious in etiology, pulmonary edema could have a similar
appearance.
3. Right hilar adenopathy, likely reactive.
